# Patient Record
Sex: Female | Born: 1980 | Race: White | Hispanic: No | Marital: Single | Smoking: Never smoker
Health system: Southern US, Community
[De-identification: ages and names within clinical notes are randomized; demographics above are authoritative.]

## PROBLEM LIST (undated history)

## (undated) DIAGNOSIS — F329 Major depressive disorder, single episode, unspecified: Secondary | ICD-10-CM

## (undated) DIAGNOSIS — F32A Depression, unspecified: Secondary | ICD-10-CM

## (undated) DIAGNOSIS — F419 Anxiety disorder, unspecified: Secondary | ICD-10-CM

## (undated) HISTORY — PX: NO PAST SURGERIES: SHX2092

---
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
## 2003-05-02 ENCOUNTER — Encounter: Payer: Self-pay | Admitting: Emergency Medicine

## 2003-05-03 ENCOUNTER — Inpatient Hospital Stay (HOSPITAL_COMMUNITY): Admission: AD | Admit: 2003-05-03 | Discharge: 2003-05-07 | Payer: Self-pay | Admitting: Obstetrics and Gynecology

## 2003-05-09 ENCOUNTER — Other Ambulatory Visit: Admission: RE | Admit: 2003-05-09 | Discharge: 2003-05-09 | Payer: Self-pay | Admitting: Obstetrics and Gynecology

## 2003-10-01 ENCOUNTER — Encounter (HOSPITAL_COMMUNITY): Admission: RE | Admit: 2003-10-01 | Discharge: 2003-12-30 | Payer: Self-pay | Admitting: Internal Medicine

## 2005-01-23 IMAGING — CT CT ABDOMEN W/O CM
1 series · 16 of 32 positions shown, 20 images · non-contrast
Comparison: none

CLINICAL DATA: Right flank pain.
 CT SCAN OF THE ABDOMEN AND PELVIS WITHOUT CONTRAST
 Scans were performed using the renal stone protocol.  
 The scan demonstrates that the patient has a hemoperitoneum with blood in the right paracolic gutter and extending into the pelvis.  The visualized portions of the liver and spleen appear normal.  Pancreas and adrenal glands and kidneys are normal.  There is a moderate amount of stool in the colon.  The appendix is visible and appears normal.
 IMPRESSION 
 Hemoperitoneum.  There is blood in both paracolic gutters, primarily on the right.
 CT SCAN OF THE PELVIS WITHOUT CONTRAST
 There is blood in the pelvis surrounding the uterus and ovaries.  The blood is of mixed age.  The appearance is nonspecific, but the possibility of a ruptured hemorrhagic ovarian cyst or ruptured ectopic pregnancy should be considered.  
 Hemorrhage into the pelvis as described.
 [REDACTED]

[Series 2: renal stone · axial · 0.60mm/px · z∈[-367,-32]mm · 16 of 75 slices shown, 20 images]
[im 5/75  soft-tissue]
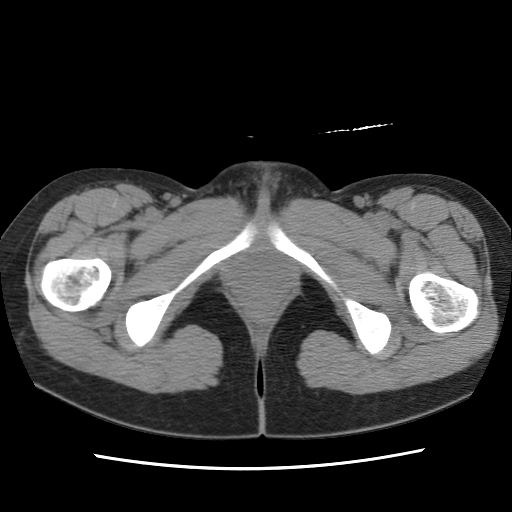
[im 5/75  bone]
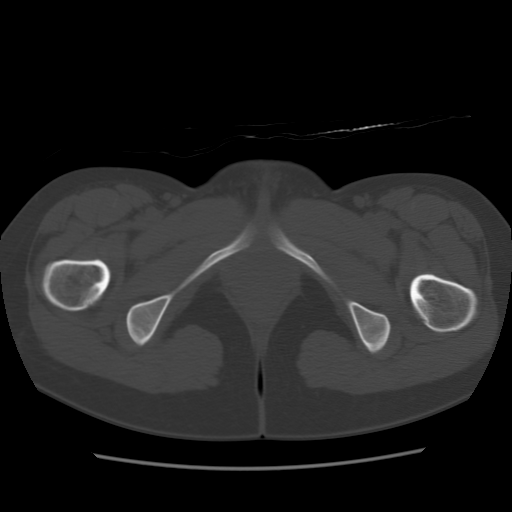
[im 10/75  soft-tissue]
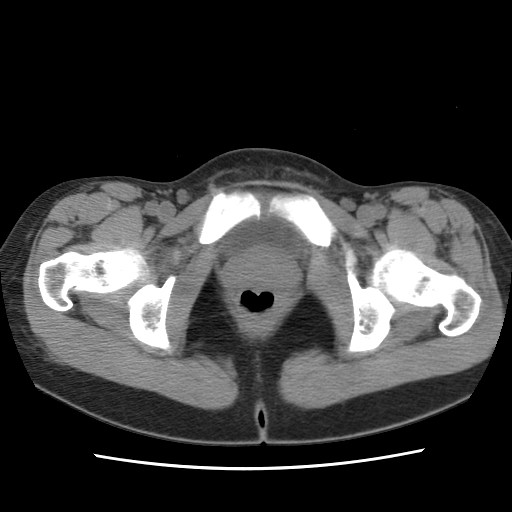
[im 15/75  soft-tissue]
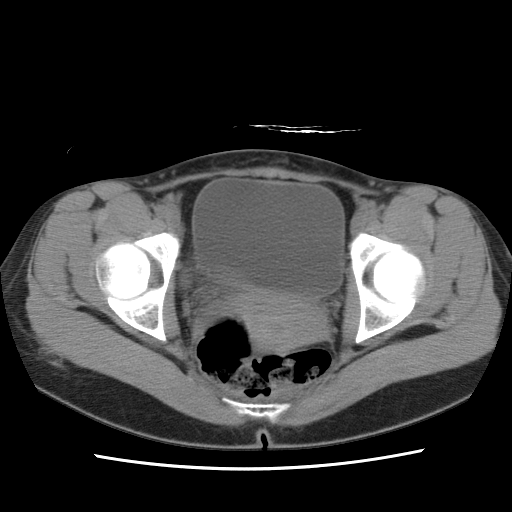
[im 20/75  soft-tissue]
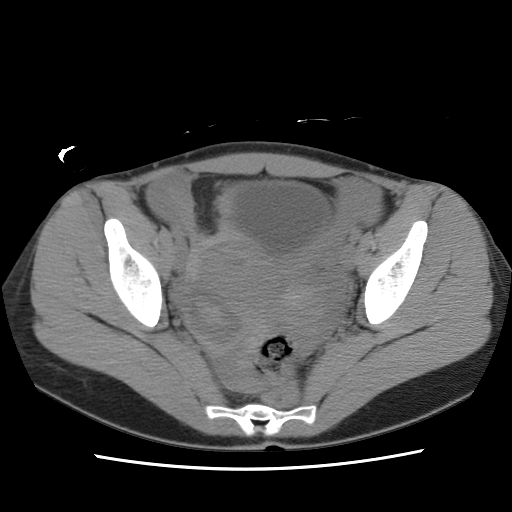
[im 24/75  soft-tissue]
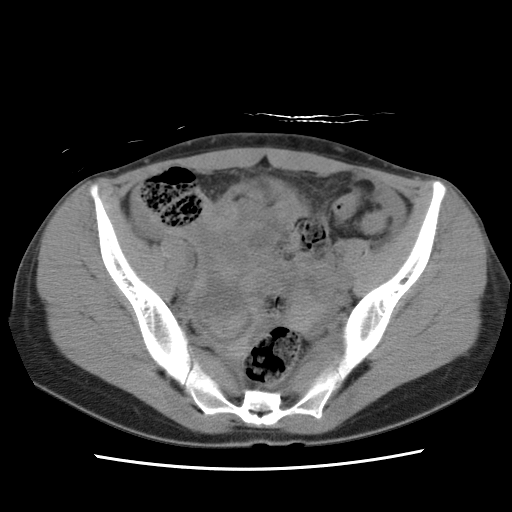
[im 29/75  soft-tissue]
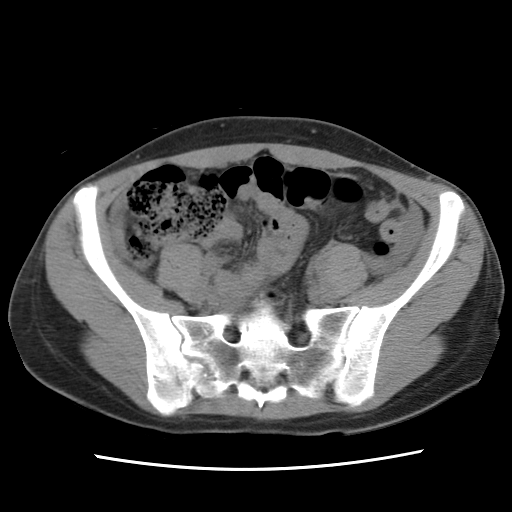
[im 34/75  soft-tissue]
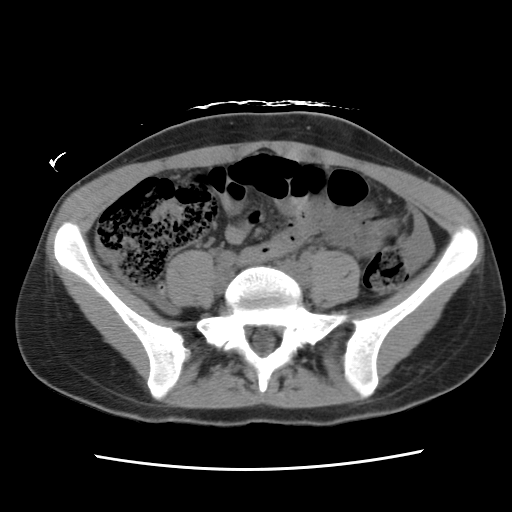
[im 41/75  soft-tissue]
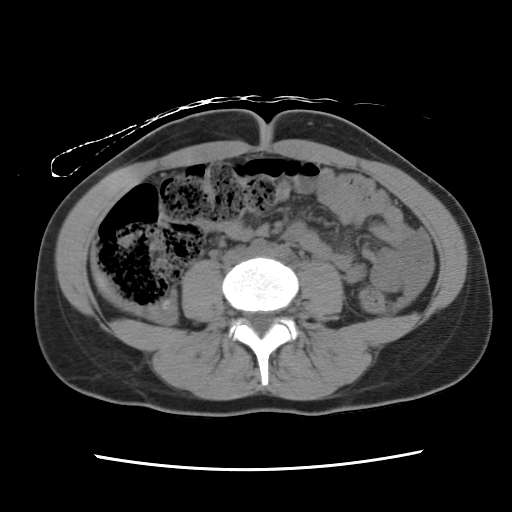
[im 46/75  soft-tissue]
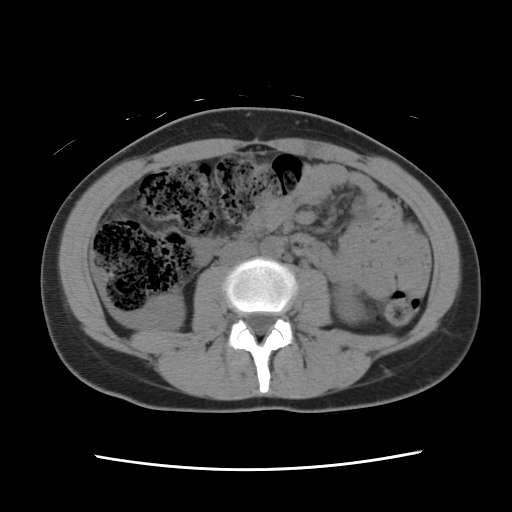
[im 46/75  bone]
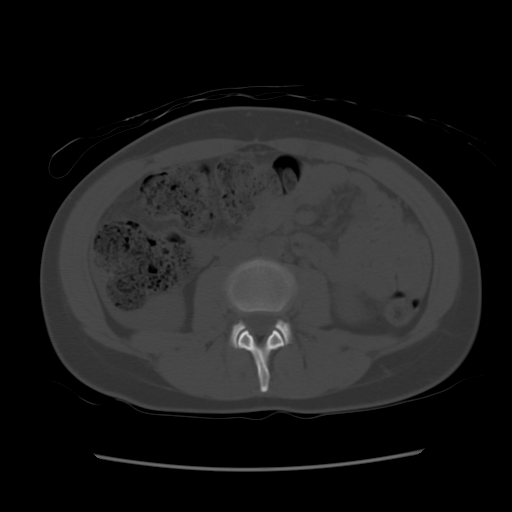
[im 51/75  soft-tissue]
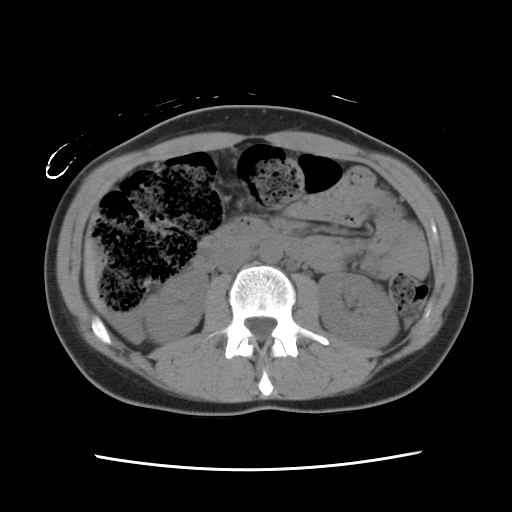
[im 55/75  soft-tissue]
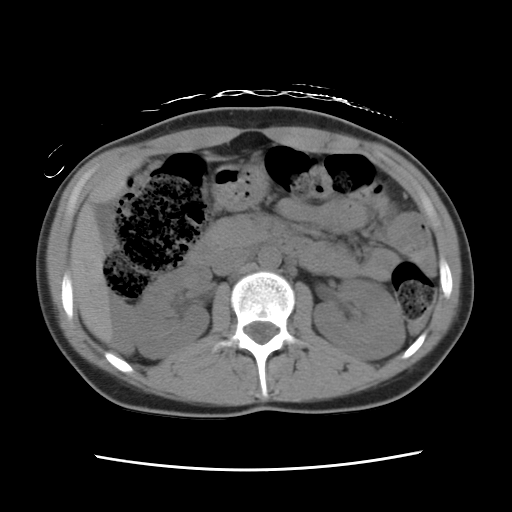
[im 60/75  soft-tissue]
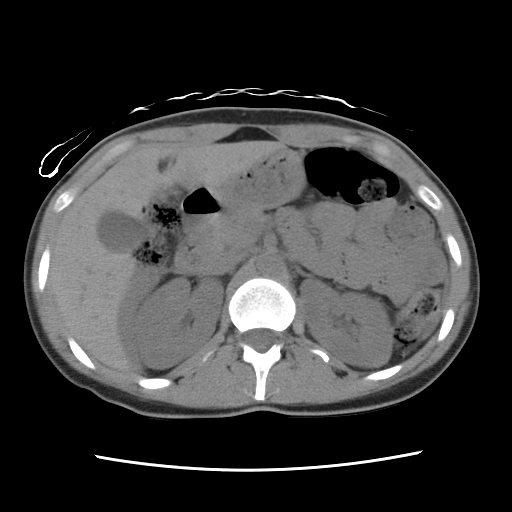
[im 65/75  soft-tissue]
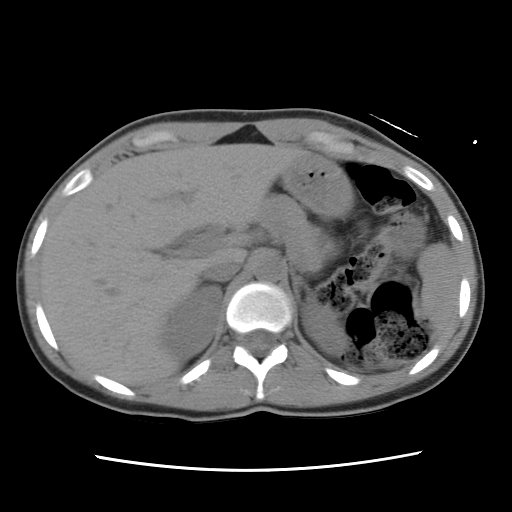
[im 65/75  lung]
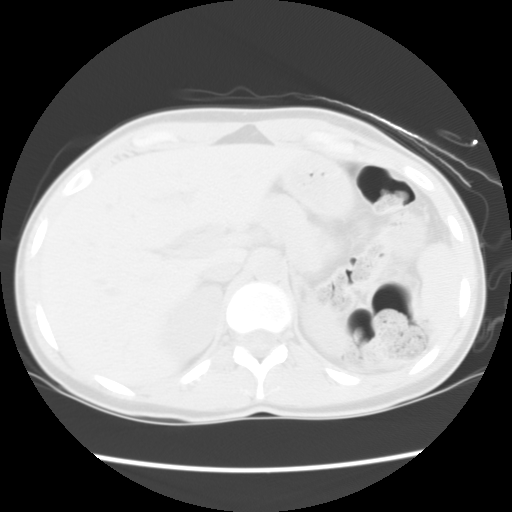
[im 67/75  lung]
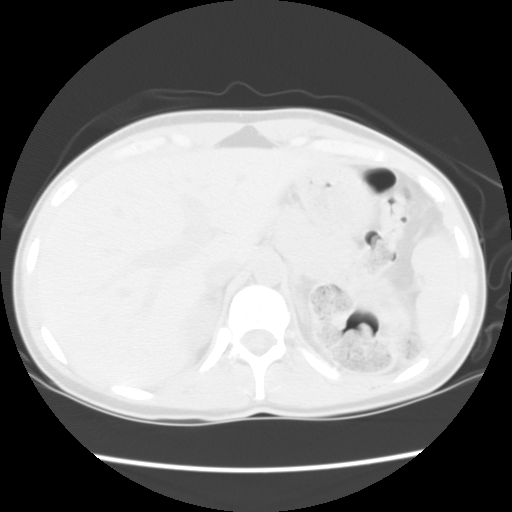
[im 70/75  soft-tissue]
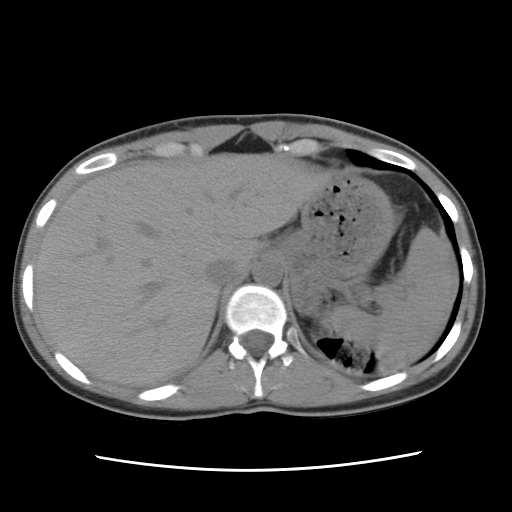
[im 70/75  lung]
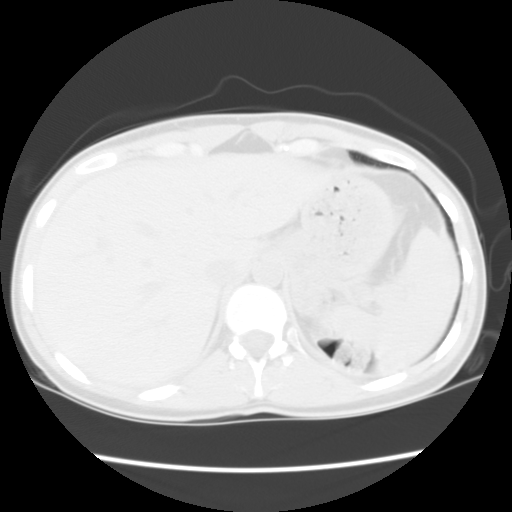
[im 72/75  lung]
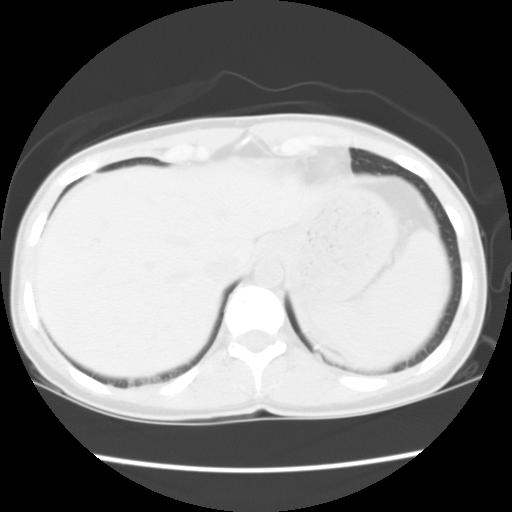

[16 of 32 positions shown; findings below may reference images not displayed]

## 2005-02-15 ENCOUNTER — Other Ambulatory Visit: Admission: RE | Admit: 2005-02-15 | Discharge: 2005-02-15 | Payer: Self-pay | Admitting: Obstetrics and Gynecology

## 2005-04-22 ENCOUNTER — Emergency Department (HOSPITAL_COMMUNITY): Admission: EM | Admit: 2005-04-22 | Discharge: 2005-04-22 | Payer: Self-pay | Admitting: Emergency Medicine

## 2009-01-11 ENCOUNTER — Emergency Department (HOSPITAL_COMMUNITY): Admission: EM | Admit: 2009-01-11 | Discharge: 2009-01-11 | Payer: Self-pay | Admitting: Emergency Medicine

## 2010-07-30 LAB — URINE MICROSCOPIC-ADD ON

## 2010-07-30 LAB — HCG, QUANTITATIVE, PREGNANCY: hCG, Beta Chain, Quant, S: 13 m[IU]/mL — ABNORMAL HIGH (ref <5)

## 2010-07-30 LAB — URINALYSIS, ROUTINE W REFLEX MICROSCOPIC
Glucose, UA: NEGATIVE mg/dL
Ketones, ur: 15 mg/dL — AB
pH: 6 (ref 5.0–8.0)

## 2010-07-30 LAB — ABO/RH: ABO/RH(D): O POS

## 2010-07-30 LAB — PREGNANCY, URINE: Preg Test, Ur: POSITIVE

## 2010-09-10 NOTE — H&P (Signed)
NAME:  Shelley Diaz, Shelley Diaz                        ACCOUNT NO.:  [id number]   MEDICAL RECORD NO.:  [id number]                   PATIENT TYPE:  OBV   LOCATION:  9315                                 FACILITY:  [facility]   PHYSICIAN:  Tracie Harrier, M.D.              DATE OF BIRTH:  1980-08-23   DATE OF ADMISSION:  05/02/2003  DATE OF DISCHARGE:                                HISTORY & PHYSICAL   HISTORY OF PRESENT ILLNESS:  Ms. Posch is a 30 year old female gravida 1  para 0 admitted from the [facility] emergency room for a ruptured ovarian cyst  with hemoperitoneum.  The patient underwent an extensive workup in the [facility]  ER where her serum pregnancy test was negative x2.  An imaging procedure  showed a large hemoperitoneum with no mass.  She is admitted for clinical  observation and IV hydration and pain management.   MEDICAL HISTORY:  History of cervical dysplasia.   SURGICAL HISTORY:  Wisdom teeth extraction.   OBSTETRICAL HISTORY:  TAB x1.   CURRENT MEDICATIONS:  Flonase and Clarinex.   ALLERGIES:  None known.   PHYSICAL EXAMINATION:  VITAL SIGNS:  Stable, temperature 99.3  GENERAL:  She she is a well-developed, well-nourished female in no acute  distress.  HEENT:  Within normal limits.  NECK:  Supple without adenopathy or thyromegaly.  HEART AND LUNGS:  Clear.  BREAST:  Deferred.  ABDOMEN:  Soft and exquisitely tender with some rebound tenderness.  No mass  identified.  EXTREMITIES AND NEUROLOGIC:  Grossly normal.  PELVIC:  Deferred.   Hemoglobin 12.1.   ASSESSMENT:  1. Ruptured ovarian cyst.  2. Hemoperitoneum.   PLAN:  1. Bedrest.  2. Clinical observation.  3. IV hydration and pain management.  4. Check CBC in a.m.                                               Tracie Harrier, M.D.    REG/MEDQ  D:  05/02/2003  T:  05/02/2003  Job:  [id number]

## 2010-09-10 NOTE — Discharge Summary (Signed)
NAME:  Shelley Diaz, Shelley Diaz                        ACCOUNT NO.:  [id number]   MEDICAL RECORD NO.:  [id number]                   PATIENT TYPE:  INP   LOCATION:  9315                                 FACILITY:  [facility]   PHYSICIAN:  Duke Salvia. Marcelle Overlie, M.D.            DATE OF BIRTH:  Dec 01, 1980   DATE OF ADMISSION:  05/02/2003  DATE OF DISCHARGE:  05/07/2003                                 DISCHARGE SUMMARY   ADMITTING DIAGNOSIS:  Ruptured ovarian cyst with hemoperitoneum.   DISCHARGE DIAGNOSIS:  Resolving ruptured ovarian cyst with hemoperitoneum.   REASON FOR ADMISSION:  Please see dictated H&P.   HOSPITAL COURSE:  The patient was a 30 year old white single female, gravida  1, para 0, that was admitted from [facility] Emergency Room with findings of a  ruptured ovarian cyst with hemoperitoneum.  The patient underwent extensive  workup at [facility] which included a negative urine pregnancy test, complete  blood count, Chem 29, urinalysis, and vaginal smear.  CT scan was performed  which revealed a ruptured ovarian cyst with hemoperitoneum.  The patient was  then transferred to [facility] for observation and pain  management.  Coagulation studies were performed which were within normal  limits.  The patient continued to complain of pain with poor results from  Tylox and Toradol.  She was then managed by a morphine PCA pump.  The next  day, the patient did complain of pain in the right upper quadrant.  Amylase  and lipase were within normal limits.  Pepcid IV was administered.  On the  following day, the patient continued to complain of pain.  Repeat CT scan  revealed minimal atelectasis at the lung bases and moderate fecal material  throughout the colon.  Dulcolax suppository was administered with small  results.  Fleet enema was then given with good results.  On hospital day  five, the patient was feeling better.  Vital signs were stable.  She was  afebrile.  She was tolerating a regular  diet.  Abdomen was soft with  moderate tenderness bilaterally in the lower quadrant.  Discharge  instructions were given and she was discharged home.   CONDITION ON DISCHARGE:  Stable.   DIET:  Regular as tolerated.   ACTIVITY:  No strenuous activity.  No vaginal entry.   FOLLOWUP:  The patient is to follow up in the office in two days.  She is to  call for increase in pain, vaginal bleeding, or temp greater than 100.5   DISCHARGE MEDICATIONS:  1. Tylox, #30, one p.o. q.4-6h. as needed for pain.  2. Motrin 600 mg q.6h. as needed.     Julio Sicks, N.P.                        Richard M. Marcelle Overlie, M.D.    CC/MEDQ  D:  06/23/2003  T:  06/23/2003  Job:  [id number]

## 2010-10-05 IMAGING — US US OB TRANSVAGINAL
1 series · 13 of 28 positions shown · non-contrast
Comparison: None available.

CLINICAL DATA: Positive pregnancy test.  Pelvic pain.

OBSTETRIC <14 WK US AND TRANSVAGINAL OB US
TECHNIQUE: Both transabdominal and transvaginal ultrasound
examinations were performed for complete evaluation of the
gestation as well as the maternal uterus, adnexal regions, and
pelvic cul-de-sac.

[Series 1: us ob transvaginal · 0.28mm/px · 13 of 59 slices shown]
[im 3/59]
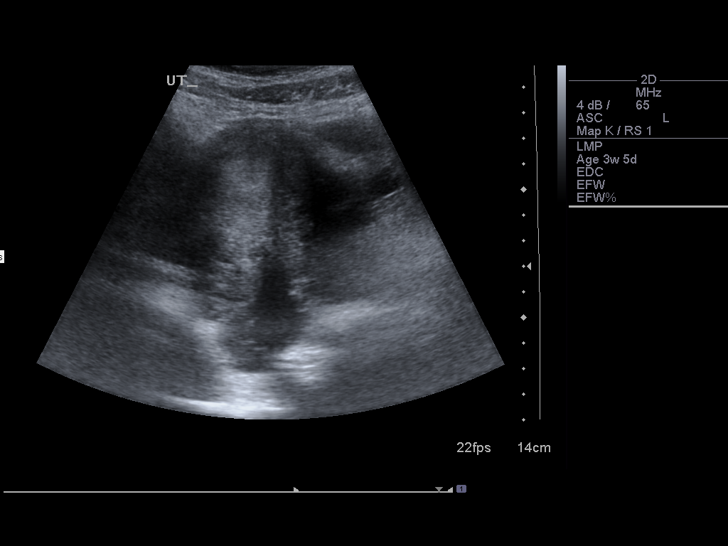
[im 7/59]
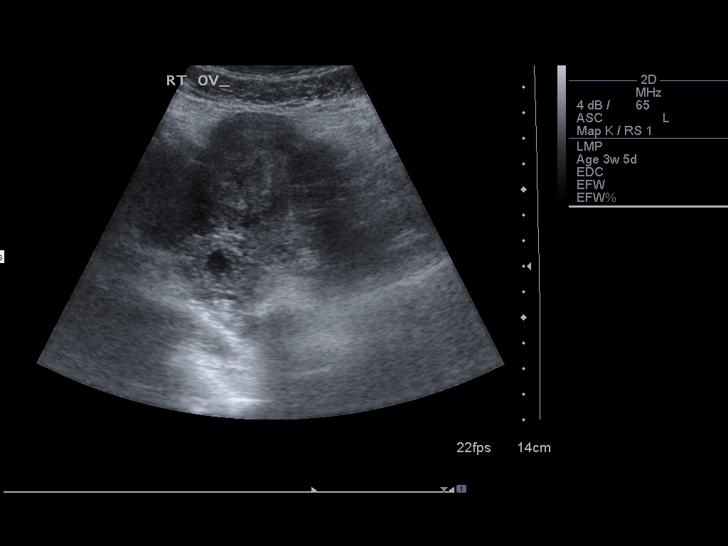
[im 11/59]
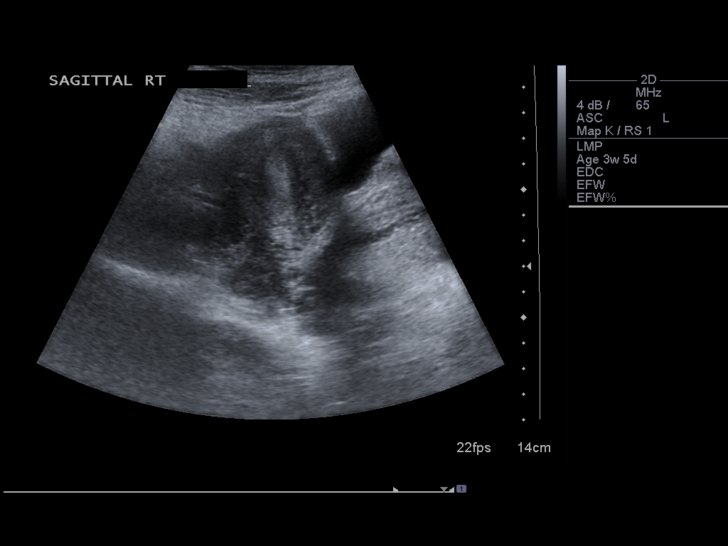
[im 16/59]
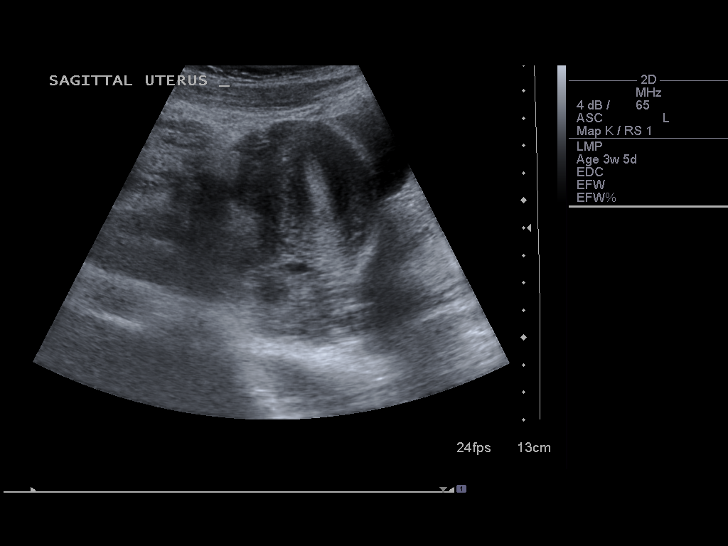
[im 20/59]
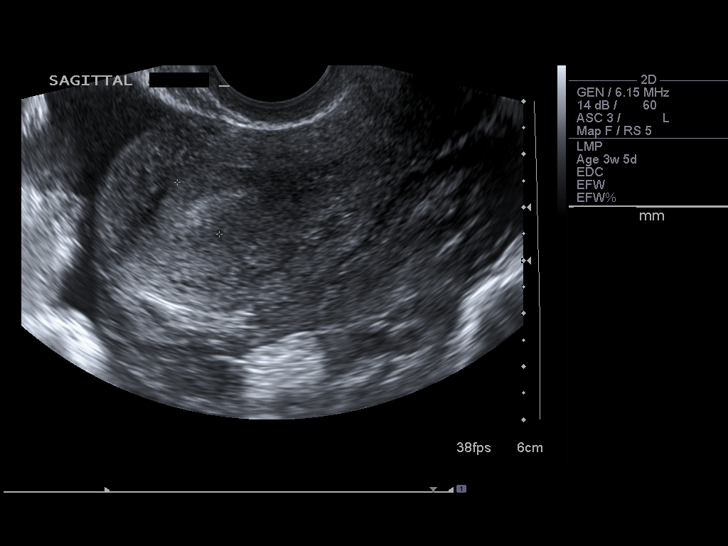
[im 24/59]
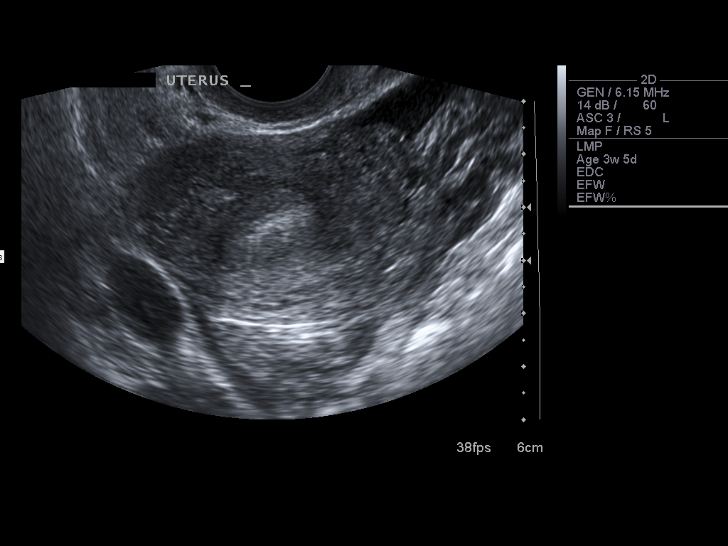
[im 31/59]
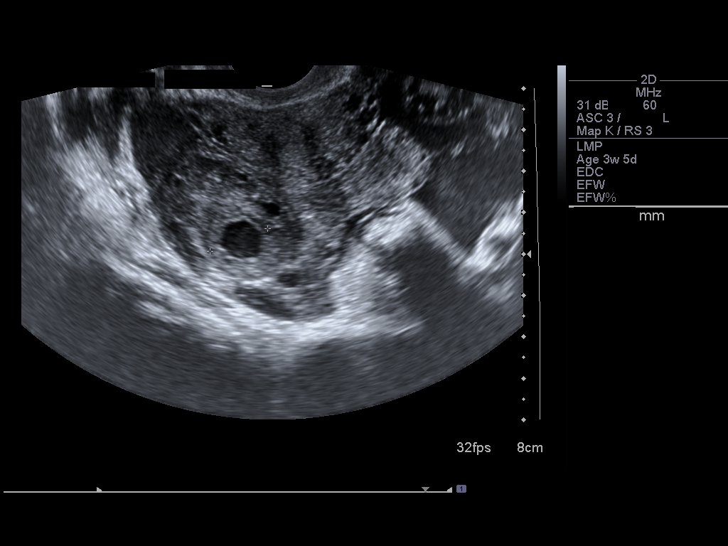
[im 35/59]
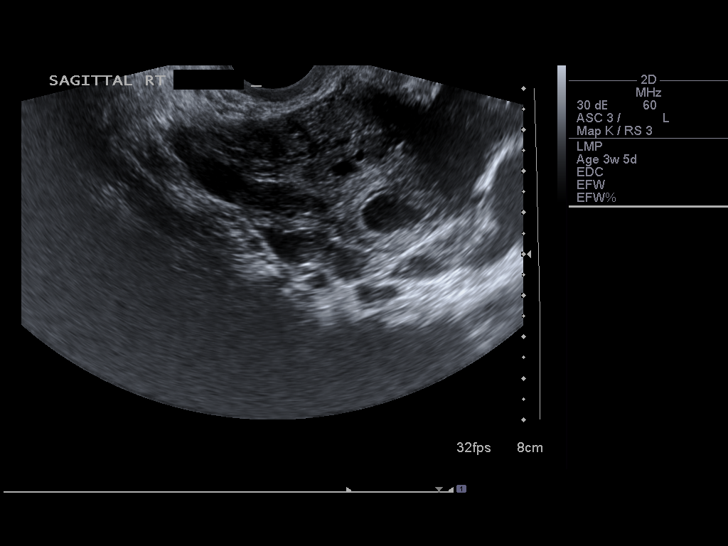
[im 39/59]
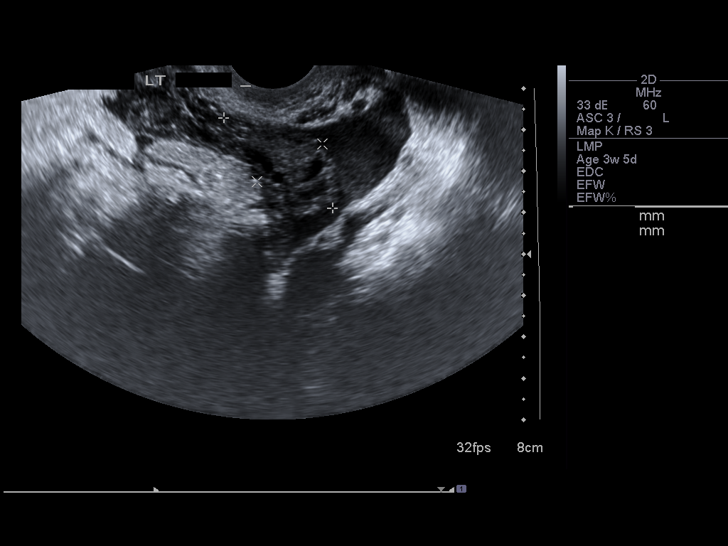
[im 43/59]
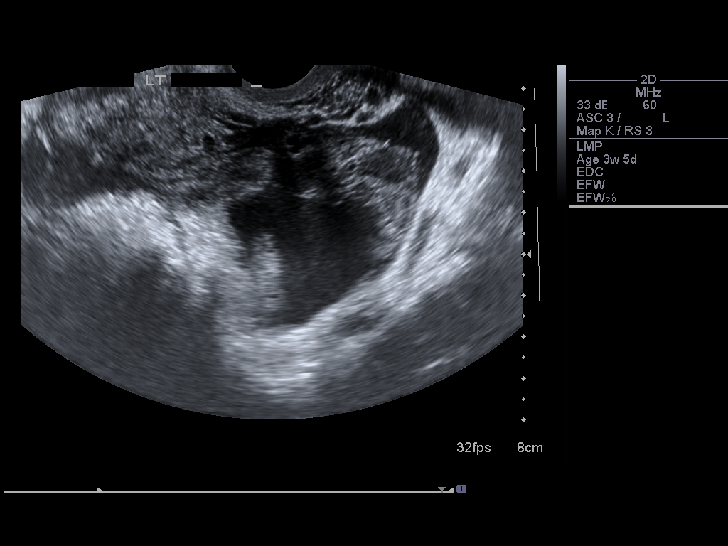
[im 48/59]
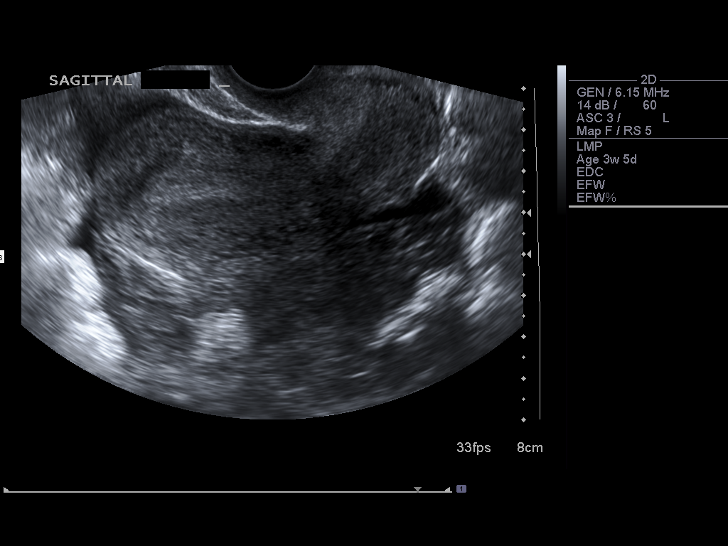
[im 52/59]
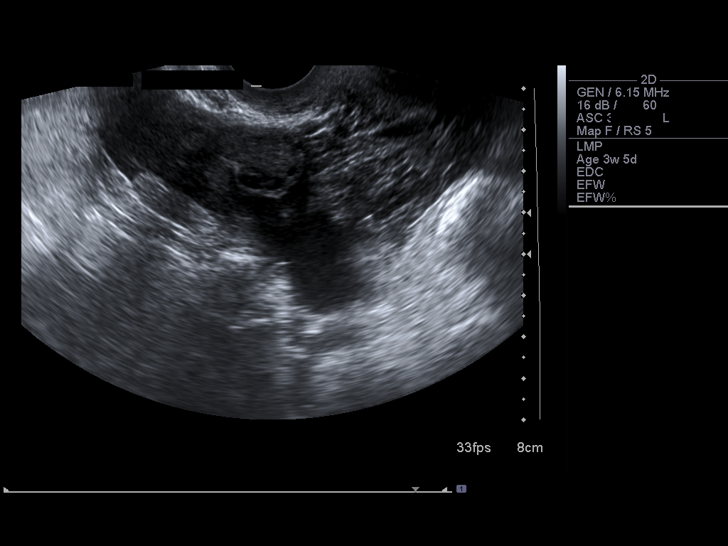
[im 56/59]
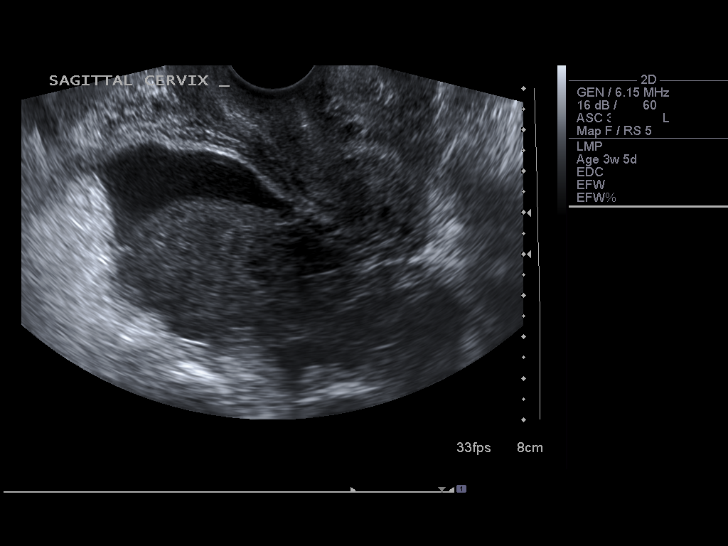

[13 of 28 positions shown; findings below may reference images not displayed]

Intrauterine gestational sac: Not identified
Yolk sac: Not identified
Embryo: Not identified
Cardiac Activity: Not identified

Maternal uterus/adnexae:
The uterus is of normal size and echotexture, measuring 7.3 x 4.4 x
5.3 cm.  The endometrial canal measures 1.3 cm.

The right ovary measures 4.4 x 2.6 x 3.2 cm.  Multiple small
follicles are present.  The dominant complex cyst measures 1.3 x
1.1 x 1.5 cm.

The left ovary measures 3.4 x 1.8 x 3.2 cm. It is within normal
limits.

A moderate amount of free fluid appears simple.
IMPRESSION: 1.  No evidence for intrauterine pregnancy.  The quantitative beta
HCG was 13.  This could represent a very early normal pregnancy or
a missed abortion.
2.  Dominant cyst of the right ovary demonstrates a thick wall.
This may represent a corpus luteal cyst.
3.  Recommend follow up with quantitative beta HCG and ultrasound
as necessary.

## 2011-05-10 LAB — OB RESULTS CONSOLE RUBELLA ANTIBODY, IGM: Rubella: IMMUNE

## 2011-05-10 LAB — OB RESULTS CONSOLE HIV ANTIBODY (ROUTINE TESTING): HIV: NONREACTIVE

## 2011-05-10 LAB — OB RESULTS CONSOLE HEPATITIS B SURFACE ANTIGEN: Hepatitis B Surface Ag: NEGATIVE

## 2011-05-10 LAB — OB RESULTS CONSOLE ANTIBODY SCREEN: Antibody Screen: NEGATIVE

## 2011-10-20 LAB — OB RESULTS CONSOLE GBS: GBS: NEGATIVE

## 2011-11-16 ENCOUNTER — Inpatient Hospital Stay (HOSPITAL_COMMUNITY)
Admission: AD | Admit: 2011-11-16 | Discharge: 2011-11-18 | DRG: 373 | Disposition: A | Discharge status: Home / Self Care | Payer: BC Managed Care – PPO | Source: Ambulatory Visit | Attending: Obstetrics and Gynecology | Admitting: Obstetrics and Gynecology

## 2011-11-16 ENCOUNTER — Encounter (HOSPITAL_COMMUNITY): Payer: Self-pay | Admitting: *Deleted

## 2011-11-16 ENCOUNTER — Inpatient Hospital Stay (HOSPITAL_COMMUNITY): Payer: BC Managed Care – PPO | Admitting: Anesthesiology

## 2011-11-16 ENCOUNTER — Encounter (HOSPITAL_COMMUNITY): Payer: Self-pay | Admitting: Anesthesiology

## 2011-11-16 HISTORY — DX: Depression, unspecified: F32.A

## 2011-11-16 HISTORY — DX: Major depressive disorder, single episode, unspecified: F32.9

## 2011-11-16 HISTORY — DX: Anxiety disorder, unspecified: F41.9

## 2011-11-16 LAB — CBC
HCT: 36.1 % (ref 36.0–46.0)
Hemoglobin: 12 g/dL (ref 12.0–15.0)
MCH: 31.1 pg (ref 26.0–34.0)
MCHC: 33.2 g/dL (ref 30.0–36.0)
MCV: 93.5 fL (ref 78.0–100.0)
RDW: 13.3 % (ref 11.5–15.5)

## 2011-11-16 MED ORDER — ONDANSETRON HCL 4 MG PO TABS
4.0000 mg | ORAL_TABLET | ORAL | Status: DC | PRN
Start: 2011-11-16 — End: 2011-11-18

## 2011-11-16 MED ORDER — EPHEDRINE 5 MG/ML INJ: 10.0000 mg | INTRAVENOUS | Status: DC | PRN

## 2011-11-16 MED ORDER — OXYCODONE-ACETAMINOPHEN 5-325 MG PO TABS: 1.0000 | ORAL_TABLET | ORAL | Status: DC | PRN

## 2011-11-16 MED ORDER — LACTATED RINGERS IV SOLN
INTRAVENOUS | Status: DC
  Administered 2011-11-16 (×2): via INTRAVENOUS

## 2011-11-16 MED ORDER — EPHEDRINE 5 MG/ML INJ
10.0000 mg | INTRAVENOUS | Status: DC | PRN
  Filled 2011-11-16: qty 4

## 2011-11-16 MED ORDER — OXYTOCIN 40 UNITS IN LACTATED RINGERS INFUSION - SIMPLE MED
62.5000 mL/h | Freq: Once | INTRAVENOUS | Status: DC
  Filled 2011-11-16: qty 1000

## 2011-11-16 MED ORDER — DIPHENHYDRAMINE HCL 25 MG PO CAPS: 25.0000 mg | ORAL_CAPSULE | Freq: Four times a day (QID) | ORAL | Status: DC | PRN

## 2011-11-16 MED ORDER — FENTANYL 2.5 MCG/ML BUPIVACAINE 1/10 % EPIDURAL INFUSION (WH - ANES)
14.0000 mL/h | INTRAMUSCULAR | Status: DC
  Administered 2011-11-16 (×2): 14 mL/h via EPIDURAL
  Filled 2011-11-16 (×2): qty 60

## 2011-11-16 MED ORDER — WITCH HAZEL-GLYCERIN EX PADS
1.0000 "application " | MEDICATED_PAD | CUTANEOUS | Status: DC | PRN
  Administered 2011-11-16: 1 via TOPICAL

## 2011-11-16 MED ORDER — DIPHENHYDRAMINE HCL 50 MG/ML IJ SOLN: 12.5000 mg | INTRAMUSCULAR | Status: DC | PRN

## 2011-11-16 MED ORDER — BUTORPHANOL TARTRATE 1 MG/ML IJ SOLN
1.0000 mg | INTRAMUSCULAR | Status: DC | PRN
  Administered 2011-11-16: 1 mg via INTRAVENOUS
  Filled 2011-11-16: qty 1

## 2011-11-16 MED ORDER — PHENYLEPHRINE 40 MCG/ML (10ML) SYRINGE FOR IV PUSH (FOR BLOOD PRESSURE SUPPORT): 80.0000 ug | PREFILLED_SYRINGE | INTRAVENOUS | Status: DC | PRN

## 2011-11-16 MED ORDER — LACTATED RINGERS IV SOLN: 500.0000 mL | Freq: Once | INTRAVENOUS | Status: DC

## 2011-11-16 MED ORDER — SENNOSIDES-DOCUSATE SODIUM 8.6-50 MG PO TABS
2.0000 | ORAL_TABLET | Freq: Every day | ORAL | Status: DC
  Administered 2011-11-16 – 2011-11-17 (×2): 2 via ORAL

## 2011-11-16 MED ORDER — LACTATED RINGERS IV SOLN
500.0000 mL | INTRAVENOUS | Status: DC | PRN
  Administered 2011-11-16: 500 mL via INTRAVENOUS

## 2011-11-16 MED ORDER — TERBUTALINE SULFATE 1 MG/ML IJ SOLN: 0.2500 mg | Freq: Once | INTRAMUSCULAR | Status: DC | PRN

## 2011-11-16 MED ORDER — METHYLERGONOVINE MALEATE 0.2 MG PO TABS: 0.2000 mg | ORAL_TABLET | ORAL | Status: DC | PRN

## 2011-11-16 MED ORDER — SIMETHICONE 80 MG PO CHEW: 80.0000 mg | CHEWABLE_TABLET | ORAL | Status: DC | PRN

## 2011-11-16 MED ORDER — ONDANSETRON HCL 4 MG/2ML IJ SOLN: 4.0000 mg | INTRAMUSCULAR | Status: DC | PRN

## 2011-11-16 MED ORDER — LIDOCAINE HCL (PF) 1 % IJ SOLN
30.0000 mL | INTRAMUSCULAR | Status: DC | PRN
  Filled 2011-11-16: qty 30

## 2011-11-16 MED ORDER — DIBUCAINE 1 % RE OINT: 1.0000 "application " | TOPICAL_OINTMENT | RECTAL | Status: DC | PRN

## 2011-11-16 MED ORDER — PHENYLEPHRINE 40 MCG/ML (10ML) SYRINGE FOR IV PUSH (FOR BLOOD PRESSURE SUPPORT)
80.0000 ug | PREFILLED_SYRINGE | INTRAVENOUS | Status: DC | PRN
  Filled 2011-11-16: qty 5

## 2011-11-16 MED ORDER — ONDANSETRON HCL 4 MG/2ML IJ SOLN
4.0000 mg | Freq: Four times a day (QID) | INTRAMUSCULAR | Status: DC | PRN
  Administered 2011-11-16: 4 mg via INTRAVENOUS
  Filled 2011-11-16: qty 2

## 2011-11-16 MED ORDER — IBUPROFEN 600 MG PO TABS: 600.0000 mg | ORAL_TABLET | Freq: Four times a day (QID) | ORAL | Status: DC | PRN

## 2011-11-16 MED ORDER — ACETAMINOPHEN 325 MG PO TABS: 650.0000 mg | ORAL_TABLET | ORAL | Status: DC | PRN

## 2011-11-16 MED ORDER — OXYTOCIN BOLUS FROM INFUSION
250.0000 mL | Freq: Once | INTRAVENOUS | Status: AC
  Administered 2011-11-16: 250 mL via INTRAVENOUS
  Filled 2011-11-16: qty 500

## 2011-11-16 MED ORDER — IBUPROFEN 600 MG PO TABS
600.0000 mg | ORAL_TABLET | Freq: Four times a day (QID) | ORAL | Status: DC
  Administered 2011-11-16 – 2011-11-18 (×7): 600 mg via ORAL
  Filled 2011-11-16 (×7): qty 1

## 2011-11-16 MED ORDER — MEDROXYPROGESTERONE ACETATE 150 MG/ML IM SUSP: 150.0000 mg | INTRAMUSCULAR | Status: DC | PRN

## 2011-11-16 MED ORDER — CITRIC ACID-SODIUM CITRATE 334-500 MG/5ML PO SOLN: 30.0000 mL | ORAL | Status: DC | PRN

## 2011-11-16 MED ORDER — BENZOCAINE-MENTHOL 20-0.5 % EX AERO
1.0000 "application " | INHALATION_SPRAY | CUTANEOUS | Status: DC | PRN
  Administered 2011-11-17: 1 via TOPICAL
  Filled 2011-11-16: qty 56

## 2011-11-16 MED ORDER — PRENATAL MULTIVITAMIN CH
1.0000 | ORAL_TABLET | Freq: Every day | ORAL | Status: DC
  Administered 2011-11-17 – 2011-11-18 (×2): 1 via ORAL
  Filled 2011-11-16 (×2): qty 1

## 2011-11-16 MED ORDER — TETANUS-DIPHTH-ACELL PERTUSSIS 5-2.5-18.5 LF-MCG/0.5 IM SUSP: 0.5000 mL | Freq: Once | INTRAMUSCULAR | Status: DC

## 2011-11-16 MED ORDER — METHYLERGONOVINE MALEATE 0.2 MG/ML IJ SOLN: 0.2000 mg | INTRAMUSCULAR | Status: DC | PRN

## 2011-11-16 MED ORDER — OXYTOCIN 40 UNITS IN LACTATED RINGERS INFUSION - SIMPLE MED
1.0000 m[IU]/min | INTRAVENOUS | Status: DC
  Administered 2011-11-16: 2 m[IU]/min via INTRAVENOUS

## 2011-11-16 MED ORDER — LANOLIN HYDROUS EX OINT: TOPICAL_OINTMENT | CUTANEOUS | Status: DC | PRN

## 2011-11-16 MED ORDER — MEASLES, MUMPS & RUBELLA VAC ~~LOC~~ INJ: 0.5000 mL | INJECTION | Freq: Once | SUBCUTANEOUS | Status: DC

## 2011-11-16 MED ORDER — OXYCODONE-ACETAMINOPHEN 5-325 MG PO TABS
1.0000 | ORAL_TABLET | ORAL | Status: DC | PRN
  Administered 2011-11-16 – 2011-11-18 (×6): 1 via ORAL
  Filled 2011-11-16 (×6): qty 1

## 2011-11-16 MED ORDER — LIDOCAINE HCL (PF) 1 % IJ SOLN
INTRAMUSCULAR | Status: DC | PRN
  Administered 2011-11-16 (×3): 4 mL

## 2011-11-16 MED ORDER — FLEET ENEMA 7-19 GM/118ML RE ENEM: 1.0000 | ENEMA | RECTAL | Status: DC | PRN

## 2011-11-16 NOTE — H&P (Signed)
31 yo G3P0 @ 38+6 presents w/ SROM.  Clear fluid at 3am.  Starting to feel irregular ctx.  No vb.  + FM  Past History - See hollister, GBS neg  AF, VSS Gen - NAD Abd - gravid, NT, EFW 7 # Ext - No edema Cvx 1/70/-2 AROM forebag with cervical exam   A/P:  Admit Exp mngt Epidural prn

## 2011-11-16 NOTE — Progress Notes (Addendum)
SVD of vigerous female infant w/ apgars of 9,9.  Nuchal cord x 1 easily reduced. Placenta delivered spontaneous w/ 3VC.    right vaginal sidewall lac repaired w/ 3-0 vicryl rapide.  Fundus firm.  EBL 300cc .  Mom and baby stable in LDR

## 2011-11-16 NOTE — MAU Note (Signed)
Pt reports ROM at 0300, cramping.

## 2011-11-16 NOTE — Anesthesia Preprocedure Evaluation (Signed)
Anesthesia Evaluation  Patient identified by MRN, date of birth, ID band Patient awake    Reviewed: Allergy & Precautions, H&P , NPO status , Patient's Chart, lab work & pertinent test results, reviewed documented beta blocker date and time   History of Anesthesia Complications Negative for: history of anesthetic complications  Airway Mallampati: III TM Distance: >3 FB Neck ROM: full    Dental  (+) Teeth Intact   Pulmonary neg pulmonary ROS,  breath sounds clear to auscultation        Cardiovascular negative cardio ROS  Rhythm:regular Rate:Normal     Neuro/Psych PSYCHIATRIC DISORDERS (depression, anxiety) negative neurological ROS     GI/Hepatic negative GI ROS, Neg liver ROS,   Endo/Other  negative endocrine ROS  Renal/GU negative Renal ROS     Musculoskeletal   Abdominal   Peds  Hematology negative hematology ROS (+)   Anesthesia Other Findings   Reproductive/Obstetrics (+) Pregnancy                           Anesthesia Physical Anesthesia Plan  ASA: II  Anesthesia Plan: Epidural   Post-op Pain Management:    Induction:   Airway Management Planned:   Additional Equipment:   Intra-op Plan:   Post-operative Plan:   Informed Consent: I have reviewed the patients History and Physical, chart, labs and discussed the procedure including the risks, benefits and alternatives for the proposed anesthesia with the patient or authorized representative who has indicated his/her understanding and acceptance.     Plan Discussed with:   Anesthesia Plan Comments:         Anesthesia Quick Evaluation

## 2011-11-16 NOTE — Anesthesia Procedure Notes (Signed)
Epidural Patient location during procedure: OB Start time: 11/16/2011 12:05 PM Reason for block: procedure for pain  Staffing Performed by: anesthesiologist   Preanesthetic Checklist Completed: patient identified, site marked, surgical consent, pre-op evaluation, timeout performed, IV checked, risks and benefits discussed and monitors and equipment checked  Epidural Patient position: sitting Prep: site prepped and draped and DuraPrep Patient monitoring: continuous pulse ox and blood pressure Approach: midline Injection technique: LOR air  Needle:  Needle type: Tuohy  Needle gauge: 17 G Needle length: 9 cm Needle insertion depth: 5 cm cm Catheter type: closed end flexible Catheter size: 19 Gauge Catheter at skin depth: 10 cm Test dose: negative  Assessment Events: blood not aspirated, injection not painful, no injection resistance, negative IV test and no paresthesia  Additional Notes Discussed risk of headache, infection, bleeding, nerve injury and failed or incomplete block.  Patient voices understanding and wishes to proceed.

## 2011-11-16 NOTE — Progress Notes (Signed)
Pt feeling mild/mod ctx.  + LOF, clear  FHT reassuring Toco Q3-5 Cvx 2/70/-2  A/P:  Exp mngt Epidural prn

## 2011-11-16 NOTE — Progress Notes (Signed)
Pt comfortable  FHT reassuring Toco 150s reassuring w/ mild variables Cvx c/c/+1  A/P:  Will labor down ~3min, then start pushing

## 2011-11-16 NOTE — Progress Notes (Signed)
Pt comfortable w/ epidural  FHT reassuring Toco Q4 Cvx 3-4/80/-2  A/P:  Exp mngt

## 2011-11-17 LAB — CBC
HCT: 32.5 % — ABNORMAL LOW (ref 36.0–46.0)
MCH: 30.9 pg (ref 26.0–34.0)
MCV: 94.8 fL (ref 78.0–100.0)
Platelets: 223 10*3/uL (ref 150–400)
RBC: 3.43 MIL/uL — ABNORMAL LOW (ref 3.87–5.11)

## 2011-11-17 NOTE — Anesthesia Postprocedure Evaluation (Signed)
  Anesthesia Post-op Note  Patient: Shelley Diaz  Procedure(s) Performed: * No procedures listed *  Patient Location: Mother/Baby  Anesthesia Type: Epidural  Level of Consciousness: awake  Airway and Oxygen Therapy: Patient Spontanous Breathing  Post-op Pain: none  Post-op Assessment: Patient's Cardiovascular Status Stable, Respiratory Function Stable, Patent Airway, No signs of Nausea or vomiting, Adequate PO intake, Pain level controlled, No headache, No backache, No residual numbness and No residual motor weakness  Post-op Vital Signs: Reviewed and stable  Complications: No apparent anesthesia complications 

## 2011-11-17 NOTE — Progress Notes (Signed)
UR chart review completed.  

## 2011-11-17 NOTE — Anesthesia Postprocedure Evaluation (Signed)
  Anesthesia Post-op Note  Patient: Shelley Diaz  Procedure(s) Performed: * No procedures listed *  Patient Location: Mother/Baby  Anesthesia Type: Epidural  Level of Consciousness: awake  Airway and Oxygen Therapy: Patient Spontanous Breathing  Post-op Pain: none  Post-op Assessment: Patient's Cardiovascular Status Stable, Respiratory Function Stable, Patent Airway, No signs of Nausea or vomiting, Adequate PO intake, Pain level controlled, No headache, No backache, No residual numbness and No residual motor weakness  Post-op Vital Signs: Reviewed and stable  Complications: No apparent anesthesia complications

## 2011-11-17 NOTE — Progress Notes (Signed)
Post Partum Day 1 Subjective: no complaints, up ad lib, voiding and tolerating PO  Objective: Blood pressure 97/65, pulse 88, temperature 98.3 F (36.8 C), temperature source Oral, resp. rate 18, height 5\' 2"  (1.575 m), weight 70.308 kg (155 lb), SpO2 96.00%, unknown if currently breastfeeding.  Physical Exam:  General: alert and cooperative Lochia: appropriate Uterine Fundus: firm Incision: perineum intact , small hemorrhoid DVT Evaluation: No evidence of DVT seen on physical exam.   Basename 11/17/11 0512 11/16/11 0758  HGB 10.6* 12.0  HCT 32.5* 36.1    Assessment/Plan: Plan for discharge tomorrow   LOS: 1 day   Nicol Herbig G 11/17/2011, 7:45 AM

## 2011-11-18 MED ORDER — OXYCODONE-ACETAMINOPHEN 5-325 MG PO TABS: 1.0000 | ORAL_TABLET | ORAL | Status: AC | PRN

## 2011-11-18 MED ORDER — IBUPROFEN 600 MG PO TABS: 600.0000 mg | ORAL_TABLET | Freq: Four times a day (QID) | ORAL | Status: AC

## 2011-11-18 NOTE — Discharge Summary (Signed)
Obstetric Discharge Summary Reason for Admission: rupture of membranes Prenatal Procedures: ultrasound Intrapartum Procedures: spontaneous vaginal delivery Postpartum Procedures: none Complications-Operative and Postpartum: r vag sidewall Hemoglobin  Date Value Range Status  11/17/2011 10.6* 12.0 - 15.0 Diaz/dL Final     HCT  Date Value Range Status  11/17/2011 32.5* 36.0 - 46.0 % Final    Physical Exam:  General: alert and cooperative Lochia: appropriate Uterine Fundus: firm Incision: perineum intact DVT Evaluation: No evidence of DVT seen on physical exam.  Discharge Diagnoses: Term Pregnancy-delivered  Discharge Information: Date: 11/18/2011 Activity: pelvic rest Diet: routine Medications: PNV, Ibuprofen and Percocet Condition: stable Instructions: refer to practice specific booklet Discharge to: home   Newborn Data: Live born female  Birth Weight: 7 lb 9.5 oz ([id number] Diaz) APGAR: 9, 9  Home with mother.  Shelley Diaz 11/18/2011, 7:49 AM

## 2011-11-18 NOTE — Progress Notes (Signed)
Pt discharged before Sw could assess history of depression/anxiety. 

## 2014-02-24 ENCOUNTER — Encounter (HOSPITAL_COMMUNITY): Payer: Self-pay | Admitting: *Deleted

## 2018-04-14 ENCOUNTER — Ambulatory Visit (INDEPENDENT_AMBULATORY_CARE_PROVIDER_SITE_OTHER): Payer: BLUE CROSS/BLUE SHIELD

## 2018-04-14 ENCOUNTER — Encounter: Payer: Self-pay | Admitting: Podiatry

## 2018-04-14 ENCOUNTER — Ambulatory Visit: Payer: BLUE CROSS/BLUE SHIELD | Admitting: Podiatry

## 2018-04-14 VITALS — BP 100/58 | HR 68 | Ht 63.0 in | Wt 112.0 lb

## 2018-04-14 DIAGNOSIS — M722 Plantar fascial fibromatosis: Secondary | ICD-10-CM

## 2018-04-14 DIAGNOSIS — M79671 Pain in right foot: Secondary | ICD-10-CM | POA: Diagnosis not present

## 2018-04-14 MED ORDER — MELOXICAM 15 MG PO TABS: 15.0000 mg | ORAL_TABLET | Freq: Every day | ORAL | 1 refills | Status: AC

## 2018-04-21 NOTE — Progress Notes (Signed)
   Subjective: 37 year old female presenting today as a new patient with a chief complaint of sudden onset right heel pain that began one week ago. She describes the pain as sharp shooting pain and states it feels as if she is walking on the bone. She states the pain is worse in the morning when she first wakes up or when she stands from a seated position. She has been trying to rest the foot as much as possible and applying ice therapy for treatment. Patient is here for further evaluation and treatment.   Past Medical History:  Diagnosis Date  . Anxiety   . Depression      Objective: Physical Exam General: The patient is alert and oriented x3 in no acute distress.  Dermatology: Skin is warm, dry and supple bilateral lower extremities. Negative for open lesions or macerations bilateral.   Vascular: Dorsalis Pedis and Posterior Tibial pulses palpable bilateral.  Capillary fill time is immediate to all digits.  Neurological: Epicritic and protective threshold intact bilateral.   Musculoskeletal: Tenderness to palpation to the plantar aspect of the right heel along the plantar fascia. All other joints range of motion within normal limits bilateral. Strength 5/5 in all groups bilateral.   Radiographic exam: Normal osseous mineralization. Joint spaces preserved. No fracture/dislocation/boney destruction. No other soft tissue abnormalities or radiopaque foreign bodies.   Assessment: 1. Plantar fasciitis right 2. Pain in right foot  Plan of Care:  1. Patient evaluated. Xrays reviewed.   2. Injection of 0.5cc Celestone soluspan injected into the right plantar fascia  3. Rx for Meloxicam ordered for patient. 4. Plantar fascial band(s) dispensed 5. Instructed patient regarding therapies and modalities at home to alleviate symptoms.  6. Recommended good shoe gear.  7. Return to clinic in 4 weeks.    Daughter is 35-years-old named Shelley Diaz.    Shelley Diaz, DPM [facility]  Dr. Felecia ShellingBrent M. Elfrieda Diaz, DPM    [address]                                        [place], [place] [address]                Office [phone]  Fax [phone]

## 2018-05-16 ENCOUNTER — Encounter: Payer: Self-pay | Admitting: Podiatry

## 2018-05-29 NOTE — Progress Notes (Signed)
This encounter was created in error - please disregard.
